# Patient Record
Sex: Female | Born: 1935 | Race: Black or African American | Hispanic: No | Smoking: Never smoker
Health system: Southern US, Community
[De-identification: ages and names within clinical notes are randomized; demographics above are authoritative.]

## PROBLEM LIST (undated history)

## (undated) DIAGNOSIS — I10 Essential (primary) hypertension: Secondary | ICD-10-CM

## (undated) DIAGNOSIS — I358 Other nonrheumatic aortic valve disorders: Secondary | ICD-10-CM

## (undated) HISTORY — PX: APPENDECTOMY: SHX54

---

## 2004-12-07 ENCOUNTER — Ambulatory Visit: Payer: Self-pay | Admitting: Unknown Physician Specialty

## 2004-12-27 ENCOUNTER — Ambulatory Visit: Payer: Self-pay | Admitting: Unknown Physician Specialty

## 2005-02-21 ENCOUNTER — Ambulatory Visit: Payer: Self-pay | Admitting: Gastroenterology

## 2006-03-22 ENCOUNTER — Ambulatory Visit: Payer: Self-pay | Admitting: Unknown Physician Specialty

## 2007-04-26 ENCOUNTER — Ambulatory Visit: Payer: Self-pay | Admitting: Unknown Physician Specialty

## 2007-12-28 ENCOUNTER — Ambulatory Visit: Payer: Self-pay | Admitting: Unknown Physician Specialty

## 2008-05-01 ENCOUNTER — Ambulatory Visit: Payer: Self-pay | Admitting: Unknown Physician Specialty

## 2009-05-04 ENCOUNTER — Ambulatory Visit: Payer: Self-pay | Admitting: Unknown Physician Specialty

## 2010-04-23 ENCOUNTER — Emergency Department: Payer: Self-pay | Admitting: Emergency Medicine

## 2010-05-14 ENCOUNTER — Ambulatory Visit: Payer: Self-pay | Admitting: Unknown Physician Specialty

## 2010-06-14 ENCOUNTER — Ambulatory Visit: Payer: Self-pay | Admitting: Unknown Physician Specialty

## 2010-10-28 ENCOUNTER — Emergency Department: Payer: Self-pay | Admitting: Internal Medicine

## 2011-02-14 ENCOUNTER — Ambulatory Visit: Payer: Self-pay | Admitting: Unknown Physician Specialty

## 2011-09-07 ENCOUNTER — Ambulatory Visit: Payer: Self-pay | Admitting: Unknown Physician Specialty

## 2017-08-08 ENCOUNTER — Ambulatory Visit
Admission: RE | Admit: 2017-08-08 | Discharge: 2017-08-08 | Disposition: A | Discharge status: Home / Self Care | Payer: Medicare Other | Source: Ambulatory Visit | Attending: Family Medicine | Admitting: Family Medicine

## 2017-08-08 ENCOUNTER — Other Ambulatory Visit: Payer: Self-pay | Admitting: Family Medicine

## 2017-08-08 DIAGNOSIS — M79605 Pain in left leg: Secondary | ICD-10-CM

## 2017-08-10 ENCOUNTER — Emergency Department
Admission: EM | Admit: 2017-08-10 | Discharge: 2017-08-10 | Disposition: A | Discharge status: Home / Self Care | Payer: Medicare Other | Attending: Emergency Medicine | Admitting: Emergency Medicine

## 2017-08-10 ENCOUNTER — Emergency Department: Payer: Medicare Other

## 2017-08-10 ENCOUNTER — Other Ambulatory Visit: Payer: Self-pay

## 2017-08-10 ENCOUNTER — Encounter: Payer: Self-pay | Admitting: Emergency Medicine

## 2017-08-10 DIAGNOSIS — M109 Gout, unspecified: Secondary | ICD-10-CM | POA: Diagnosis not present

## 2017-08-10 DIAGNOSIS — Z7982 Long term (current) use of aspirin: Secondary | ICD-10-CM | POA: Insufficient documentation

## 2017-08-10 DIAGNOSIS — Z79899 Other long term (current) drug therapy: Secondary | ICD-10-CM | POA: Insufficient documentation

## 2017-08-10 DIAGNOSIS — M25562 Pain in left knee: Secondary | ICD-10-CM | POA: Diagnosis present

## 2017-08-10 DIAGNOSIS — I10 Essential (primary) hypertension: Secondary | ICD-10-CM | POA: Diagnosis not present

## 2017-08-10 HISTORY — DX: Other nonrheumatic aortic valve disorders: I35.8

## 2017-08-10 HISTORY — DX: Essential (primary) hypertension: I10

## 2017-08-10 LAB — CBC
HCT: 38.3 % (ref 35.0–47.0)
Hemoglobin: 12.5 g/dL (ref 12.0–16.0)
MCH: 28.2 pg (ref 26.0–34.0)
MCHC: 32.6 g/dL (ref 32.0–36.0)
MCV: 86.6 fL (ref 80.0–100.0)
PLATELETS: 221 10*3/uL (ref 150–440)
RBC: 4.42 MIL/uL (ref 3.80–5.20)
RDW: 14.6 % — AB (ref 11.5–14.5)
WBC: 9.4 10*3/uL (ref 3.6–11.0)

## 2017-08-10 LAB — BASIC METABOLIC PANEL
Anion gap: 14 (ref 5–15)
BUN: 35 mg/dL — AB (ref 6–20)
CALCIUM: 9.9 mg/dL (ref 8.9–10.3)
CO2: 20 mmol/L — ABNORMAL LOW (ref 22–32)
CREATININE: 1.25 mg/dL — AB (ref 0.44–1.00)
Chloride: 104 mmol/L (ref 101–111)
GFR calc Af Amer: 42 mL/min — ABNORMAL LOW (ref >60)
GFR, EST NON AFRICAN AMERICAN: 36 mL/min — AB (ref >60)
GLUCOSE: 119 mg/dL — AB (ref 65–99)
Potassium: 3.3 mmol/L — ABNORMAL LOW (ref 3.5–5.1)
Sodium: 138 mmol/L (ref 135–145)

## 2017-08-10 LAB — SYNOVIAL CELL COUNT + DIFF, W/ CRYSTALS
Eosinophils-Synovial: 0 %
LYMPHOCYTES-SYNOVIAL FLD: 2 %
Monocyte-Macrophage-Synovial Fluid: 0 %
NEUTROPHIL, SYNOVIAL: 98 %
WBC, Synovial: 22613 /mm3 — ABNORMAL HIGH (ref 0–200)

## 2017-08-10 LAB — TROPONIN I: Troponin I: <0.03 ng/mL (ref <0.03)

## 2017-08-10 LAB — URIC ACID: Uric Acid, Serum: 10.5 mg/dL — ABNORMAL HIGH (ref 2.3–6.6)

## 2017-08-10 MED ORDER — LIDOCAINE HCL (PF) 1 % IJ SOLN
INTRAMUSCULAR | Status: AC
  Filled 2017-08-10: qty 5

## 2017-08-10 MED ORDER — PREDNISONE 20 MG PO TABS: 40.0000 mg | ORAL_TABLET | Freq: Every day | ORAL | 0 refills | Status: DC

## 2017-08-10 MED ORDER — PREDNISONE 20 MG PO TABS
60.0000 mg | ORAL_TABLET | Freq: Once | ORAL | Status: AC
  Administered 2017-08-10: 60 mg via ORAL
  Filled 2017-08-10: qty 3

## 2017-08-10 NOTE — ED Notes (Signed)
Pt ambulated with assistance of walker. MD at bedside

## 2017-08-10 NOTE — ED Triage Notes (Signed)
Pt presents to ED via [facility] with c/o L leg swelling and pain and unable to ambulate. Per EMS pt has been unable to ambulate x 3 days, with leg swelling x 2 days. EMS reports pt was seen at [facility] for evaluation and blood clot was ruled out with negative exam. Pt is alert and oriented at this time.

## 2017-08-10 NOTE — Discharge Instructions (Signed)
Please take your steroids as prescribed for the next 5 days. You may also use over-the-counter ibuprofen as needed for discomfort.  Return to the emergency department for any increased pain, development of fever, or any other symptom personally concerning to yourself.  Drink plenty of fluids and obtain plenty of rest.

## 2017-08-10 NOTE — ED Notes (Signed)
Pt assisted with bed pan

## 2017-08-10 NOTE — ED Provider Notes (Addendum)
[facility] Emergency Department Provider Note  Time seen: 11:17 AM  I have reviewed the triage vital signs and the nursing notes.   HISTORY  Chief Complaint Leg Pain    HPI Ashley Wiggins is a 84 y.o. female with a past medical history of hypertension presents to the emergency department for left knee pain.  According to the patient for the past 3 days she has not been able to walk well due to left knee pain and swelling.  States she went to her doctor [facility] for evaluation.  Had an ultrasound that ruled out a blood clot and the patient was sent to the emergency department for further evaluation.  Patient denies any fever.  She does state a past history of gout one time previously in her right foot but never in her knee.  Denies any nausea or vomiting.  Patient states moderate pain in her left knee currently but becomes severe with attempted ambulation.  Largely negative review of systems.   Past Medical History:  Diagnosis Date  . Aortic valve sclerosis   . Hypertension     There are no active problems to display for this patient.   Past Surgical History:  Procedure Laterality Date  . APPENDECTOMY      Prior to Admission medications   Medication Sig Start Date End Date Taking? Authorizing Provider  amLODipine (NORVASC) 10 MG tablet Take 10 mg by mouth daily. 06/19/17  Yes [provider]  aspirin EC 81 MG tablet Take 81 mg by mouth daily.   Yes [provider]  atorvastatin (LIPITOR) 40 MG tablet Take 40 mg by mouth at bedtime. 06/16/17  Yes [provider]  diphenhydrAMINE (BENADRYL) 25 mg capsule Take 25 mg by mouth daily as needed for allergies.   Yes [provider]  potassium chloride (K-DUR) 10 MEQ tablet Take 1 tablet by mouth daily. 03/28/17  Yes [provider]  triamterene-hydrochlorothiazide (MAXZIDE-25) 37.5-25 MG tablet Take 0.5 tablets by mouth daily. 06/08/17  Yes [provider]   vitamin B-12 (CYANOCOBALAMIN) 1000 MCG tablet Take 1,000 mcg by mouth daily.   Yes [provider]    No Known Allergies  History reviewed. No pertinent family history.  Social History Social History   Tobacco Use  . Smoking status: Never Smoker  . Smokeless tobacco: Never Used  Substance Use Topics  . Alcohol use: No    Frequency: Never  . Drug use: No    Review of Systems Constitutional: Negative for fever. Eyes: Negative for visual complaints ENT: Negative for recent illness/congestion Cardiovascular: Negative for chest pain. Respiratory: Negative for shortness of breath. Gastrointestinal: Negative for abdominal pain, vomiting Genitourinary: Negative for urinary compaints Musculoskeletal: Left knee pain and swelling times 2-3 days. Skin: Negative for skin complaints  Neurological: Negative for headache All other ROS negative  ____________________________________________   PHYSICAL EXAM:  VITAL SIGNS: ED Triage Vitals  Enc Vitals Group     BP --      Pulse Rate 08/10/17 1032 (!) 102     Resp 08/10/17 1032 18     Temp 08/10/17 1032 98.4 F (36.9 C)     Temp Source 08/10/17 1032 Oral     SpO2 08/10/17 1032 98 %     Weight 08/10/17 1032 145 lb (65.8 kg)     Height 08/10/17 1032 5\' 7"  (1.702 m)     Head Circumference --      Peak Flow --      Pain  Score 08/10/17 1031 10     Pain Loc --      Pain Edu? --      Excl. in GC? --     Constitutional: Alert and oriented. Well appearing and in no distress. Eyes: Normal exam ENT   Head: Normocephalic and atraumatic.   Mouth/Throat: Mucous membranes are moist. Cardiovascular: Normal rate, regular rhythm. No murmur Respiratory: Normal respiratory effort without tachypnea nor retractions. Breath sounds are clear  Gastrointestinal: Soft and nontender. No distention.  Musculoskeletal: Moderate left knee effusion noted on examination.  Moderate tenderness to palpation appears to be neurovascularly  intact distally. Neurologic:  Normal speech and language. No gross focal neurologic deficits Skin:  Skin is warm, dry and intact.  Psychiatric: Mood and affect are normal.   ____________________________________________    EKG performed in triage.  Reviewed and interpreted by myself shows normal sinus rhythm at 98 bpm with a widened QRS, left axis deviation, largely normal intervals with nonspecific ST changes.  Does appear changed from 2011.  RADIOLOGY  IMPRESSION: Osteoarthritic change medially. Extensive subchondral cystic change noted in the medial aspect of the medial compartment along both sides of the joint. There is extensive chondrocalcinosis. Chondrocalcinosis may be seen with osteoarthritis or calcium pyrophosphate deposition disease.  No fracture or joint effusion. Spurring noted in all compartments.  There is extensive atherosclerotic calcification in the visualized superficial femoral artery, popliteal artery, and trifurcation arterial vessels.  ____________________________________________   INITIAL IMPRESSION / ASSESSMENT AND PLAN / ED COURSE  Pertinent labs & imaging results that were available during my care of the patient were reviewed by me and considered in my medical decision making (see chart for details).  Patient presents the emergency department for left knee pain and swelling times 2-3 days.  Now significant difficulty ambulating due to pain.  Moderate effusion on exam.  Differential would include septic knee, gouty arthritis.  Patient denies any trauma or falls.  We will perform a arthrocentesis as well as an x-ray to further evaluate.  I was able to review the patient's labs from 08/08/17 in care everywhere, they are largely within normal limits.   Apiration of blood/fluid Performed by: Minna Antis Verbal consent obtained. Required items: required blood products, implants, devices, and special equipment available Patient identity confirmed:  verbally with patient Preparation: Patient was prepped and draped in the usual sterile fashion. Patient tolerance: Patient tolerated the procedure well with no immediate complications.  Location of aspiration: Left knee 60 cc of yellow fluid removed.   An EKG was performed in triage showing nonspecific ST changes.  Patient has no complaints of chest pain or shortness of breath.  We will obtain lab work as a precaution including cardiac enzymes given the abnormal EKG.   Patient's labs are largely within normal limits besides an elevated uric acid level.  Synovial fluid has resulted showing 22,000 white blood cells with monosodium urate crystals again consistent with gout.  A culture has been sent.  Patient is afebrile with a normal white blood cell count.  Highly suspect gouty arthritis to be the cause of the patient's discomfort.  We will treat with 5 days of prednisone as well as ibuprofen if needed.  Patient will follow up with her doctor.  Patient states her knee actually feels much better after the fluid was removed she believes she will be able to ambulate without difficulty.  We will ensure safe ambulation prior to discharge.  X-ray consistent with arthritis/gout.  Patient is having some trouble with  ambulation.  She normally ambulates without any assistance.  We gave her a walker in the emergency department and she was able to ambulate somewhat better with a walker.  However given her trouble ambulating which she says is due to feeling like she might fall, I offered to involve social work and physical therapy for placement to a rehab facility.  At this time the patient does not wish to go to rehab facility.  The daughter states she will go home with the patient.  I discussed with them if they are unable to safely ambulate at home she needs to call her primary care doctor or return to the emergency department.  They are agreeable to this  plan. ____________________________________________   FINAL CLINICAL IMPRESSION(S) / ED DIAGNOSES  Left knee pain Gout   Minna AntisPaduchowski, Dandra Velardi, MD 08/10/17 1416    Minna AntisPaduchowski, Caniya Tagle, MD 08/10/17 1510

## 2017-08-13 LAB — BODY FLUID CULTURE: Culture: NO GROWTH

## 2018-01-11 ENCOUNTER — Emergency Department: Payer: Medicare Other

## 2018-01-11 ENCOUNTER — Other Ambulatory Visit: Payer: Self-pay

## 2018-01-11 ENCOUNTER — Encounter: Payer: Self-pay | Admitting: Intensive Care

## 2018-01-11 ENCOUNTER — Emergency Department
Admission: EM | Admit: 2018-01-11 | Discharge: 2018-01-11 | Disposition: A | Discharge status: Home / Self Care | Payer: Medicare Other | Attending: Emergency Medicine | Admitting: Emergency Medicine

## 2018-01-11 DIAGNOSIS — I10 Essential (primary) hypertension: Secondary | ICD-10-CM | POA: Diagnosis not present

## 2018-01-11 DIAGNOSIS — R609 Edema, unspecified: Secondary | ICD-10-CM

## 2018-01-11 DIAGNOSIS — Z79899 Other long term (current) drug therapy: Secondary | ICD-10-CM | POA: Diagnosis not present

## 2018-01-11 DIAGNOSIS — Z7982 Long term (current) use of aspirin: Secondary | ICD-10-CM | POA: Diagnosis not present

## 2018-01-11 DIAGNOSIS — R600 Localized edema: Secondary | ICD-10-CM | POA: Diagnosis not present

## 2018-01-11 DIAGNOSIS — R2243 Localized swelling, mass and lump, lower limb, bilateral: Secondary | ICD-10-CM | POA: Diagnosis present

## 2018-01-11 LAB — BASIC METABOLIC PANEL
ANION GAP: 16 — AB (ref 5–15)
BUN: 27 mg/dL — ABNORMAL HIGH (ref 6–20)
CALCIUM: 10 mg/dL (ref 8.9–10.3)
CHLORIDE: 103 mmol/L (ref 101–111)
CO2: 19 mmol/L — AB (ref 22–32)
Creatinine, Ser: 1.21 mg/dL — ABNORMAL HIGH (ref 0.44–1.00)
GFR calc non Af Amer: 38 mL/min — ABNORMAL LOW (ref >60)
GFR, EST AFRICAN AMERICAN: 44 mL/min — AB (ref >60)
GLUCOSE: 120 mg/dL — AB (ref 65–99)
POTASSIUM: 3.3 mmol/L — AB (ref 3.5–5.1)
Sodium: 138 mmol/L (ref 135–145)

## 2018-01-11 LAB — TROPONIN I: Troponin I: 0.03 ng/mL (ref <0.03)

## 2018-01-11 LAB — CBC WITH DIFFERENTIAL/PLATELET
BASOS PCT: 0 %
Basophils Absolute: 0 10*3/uL (ref 0–0.1)
Eosinophils Absolute: 0 10*3/uL (ref 0–0.7)
Eosinophils Relative: 0 %
HEMATOCRIT: 35.2 % (ref 35.0–47.0)
HEMOGLOBIN: 11.4 g/dL — AB (ref 12.0–16.0)
LYMPHS PCT: 10 %
Lymphs Abs: 1.1 10*3/uL (ref 1.0–3.6)
MCH: 27.7 pg (ref 26.0–34.0)
MCHC: 32.5 g/dL (ref 32.0–36.0)
MCV: 85.2 fL (ref 80.0–100.0)
MONO ABS: 1.3 10*3/uL — AB (ref 0.2–0.9)
MONOS PCT: 12 %
NEUTROS ABS: 8.6 10*3/uL — AB (ref 1.4–6.5)
Neutrophils Relative %: 78 %
Platelets: 269 10*3/uL (ref 150–440)
RBC: 4.14 MIL/uL (ref 3.80–5.20)
RDW: 14.9 % — AB (ref 11.5–14.5)
WBC: 11 10*3/uL (ref 3.6–11.0)

## 2018-01-11 MED ORDER — MEDICAL COMPRESSION STOCKINGS MISC: 0 refills | Status: DC

## 2018-01-11 NOTE — ED Triage Notes (Signed)
Patient presents today with bilateral feet swelling since Tuesday. Patient has been taking lasix as needed prescribed by PCP. Patient denies pain while sitting in wheelchair. Patient is able to ambulate some with pain.

## 2018-01-11 NOTE — ED Provider Notes (Signed)
[facility] Emergency Department Provider Note  Time seen: 5:47 PM  I have reviewed the triage vital signs and the nursing notes.   HISTORY  Chief Complaint Leg Swelling (bilateral feet swelling)    HPI Ashley Wiggins is a 84 y.o. female with a past medical history of hypertension, mild peripheral edema, presents to the emergency department for lower extremity edema.  According to the patient and daughter for the past 1 week or so the patient's feet have been swelling bilaterally.  Patient states occasionally her left lower extremity will swell she is on Lasix just to use as needed.  States for the past few days both feet have been swelling the left greater than the right.  Denies any leg pain.  Denies any chest pain or trouble breathing.  No history of blood clots.  Patient has been using Lasix for the past 2 days.   Past Medical History:  Diagnosis Date  . Aortic valve sclerosis   . Hypertension     There are no active problems to display for this patient.   Past Surgical History:  Procedure Laterality Date  . APPENDECTOMY      Prior to Admission medications   Medication Sig Start Date End Date Taking? Authorizing Provider  amLODipine (NORVASC) 10 MG tablet Take 10 mg by mouth daily. 06/19/17   [provider]  aspirin EC 81 MG tablet Take 81 mg by mouth daily.    [provider]  atorvastatin (LIPITOR) 40 MG tablet Take 40 mg by mouth at bedtime. 06/16/17   [provider]  diphenhydrAMINE (BENADRYL) 25 mg capsule Take 25 mg by mouth daily as needed for allergies.    [provider]  potassium chloride (K-DUR) 10 MEQ tablet Take 1 tablet by mouth daily. 03/28/17   [provider]  predniSONE (DELTASONE) 20 MG tablet Take 2 tablets (40 mg total) by mouth daily. 08/10/17   Minna AntisPaduchowski, Jaidy Cottam, MD  triamterene-hydrochlorothiazide (MAXZIDE-25) 37.5-25 MG tablet Take 0.5 tablets by mouth daily. 06/08/17   [provider]  vitamin B-12 (CYANOCOBALAMIN) 1000 MCG tablet Take 1,000 mcg by mouth daily.    [provider]    No Known Allergies  History reviewed. No pertinent family history.  Social History Social History   Tobacco Use  . Smoking status: Never Smoker  . Smokeless tobacco: Never Used  Substance Use Topics  . Alcohol use: No    Frequency: Never  . Drug use: No    Review of Systems Constitutional: Negative for fever. Eyes: Negative for visual complaints ENT: Negative for recent illness/congestion Cardiovascular: Negative for chest pain. Respiratory: Negative for shortness of breath. Gastrointestinal: Negative for abdominal pain Genitourinary: Negative for urinary compaints Musculoskeletal: Bilateral lower extremity edema/pedal edema, left greater than right. Skin: Negative for skin complaints  Neurological: Negative for headache All other ROS negative  ____________________________________________   PHYSICAL EXAM:  VITAL SIGNS: ED Triage Vitals [01/11/18 1603]  Enc Vitals Group     BP 123/63     Pulse Rate (!) 101     Resp 16     Temp 98.2 F (36.8 C)     Temp Source Oral     SpO2 98 %     Weight 140 lb (63.5 kg)     Height 5' (1.524 m)     Head Circumference      Peak Flow      Pain Score 0     Pain Loc  Pain Edu?      Excl. in GC?    Constitutional: Alert and oriented. Well appearing and in no distress. Eyes: Normal exam ENT   Head: Normocephalic and atraumatic.   Mouth/Throat: Mucous membranes are moist. Cardiovascular: Normal rate, regular rhythm.  2/6 systolic murmur. Respiratory: Normal respiratory effort without tachypnea nor retractions. Breath sounds are clear Gastrointestinal: Soft and nontender. No distention.  Musculoskeletal: Nontender with normal range of motion in all extremities.  Patient has moderate pedal edema in the left lower extremity, mild in the right lower extremity.  No calf tenderness.  No  discoloration. Neurologic:  Normal speech and language. No gross focal neurologic deficits  Skin:  Skin is warm, dry and intact.  Psychiatric: Mood and affect are normal.   ____________________________________________   RADIOLOGY  Ultrasound shows no evidence of DVT.  Difficult to evaluate the calf veins but unlikely DVT.  ____________________________________________   INITIAL IMPRESSION / ASSESSMENT AND PLAN / ED COURSE  Pertinent labs & imaging results that were available during my care of the patient were reviewed by me and considered in my medical decision making (see chart for details).  Patient presents emergency department for bilateral lower extremity edema, left greater than right.  History of mild peripheral edema in the past is on Lasix as needed.  Has been taking for the past 2 days but continued to have swelling.  Differential would include CHF, peripheral edema, DVT.  Will obtain an ultrasound to help rule out DVT.  We will check basic labs including cardiac enzymes as a precaution.  Patient agreeable to this plan of care.  I discussed with the patient she should be using compression stockings, daughter states the patient has been told to use them but is not in the past because they hurt her legs.  Ultrasound shows no definitive DVT.  I discussed with patient to continue Lasix as needed follow-up with her doctor and to use compression stockings.  Patient agreeable to plan of care.  Also discussed elevation especially at night and when not in use.  ____________________________________________   FINAL CLINICAL IMPRESSION(S) / ED DIAGNOSES  Peripheral edema    Minna Antis, MD 01/11/18 1930

## 2018-01-11 NOTE — ED Notes (Signed)
US called to room patient in room 14 when exam completed.

## 2018-01-11 NOTE — ED Notes (Signed)
Date and time results received: 01/11/18 1809 (use smartphrase ".now" to insert current time)  Test: Troponin Critical Value: 0.03  Name of Provider Notified: Dr. Alvera NovelPadochowski   Orders Received? Or Actions Taken?:

## 2018-11-13 IMAGING — DX DG KNEE COMPLETE 4+V*L*
4 series · 4 of 4 positions shown · non-contrast
Comparison: None.

CLINICAL DATA: Pain and swelling

EXAM:
LEFT KNEE - COMPLETE 4+ VIEW

[knee ap]
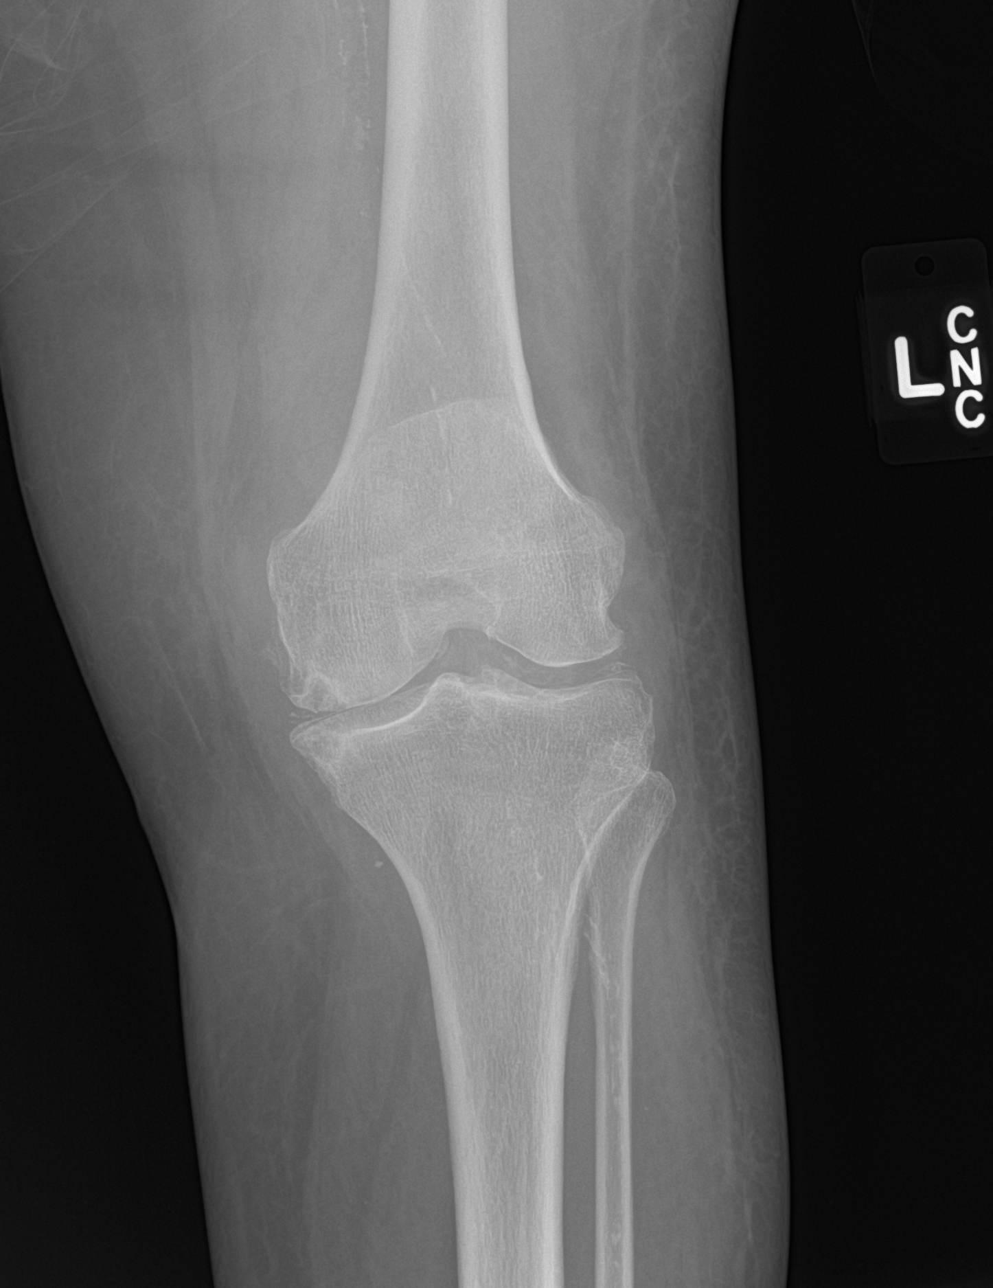

[knee lat]
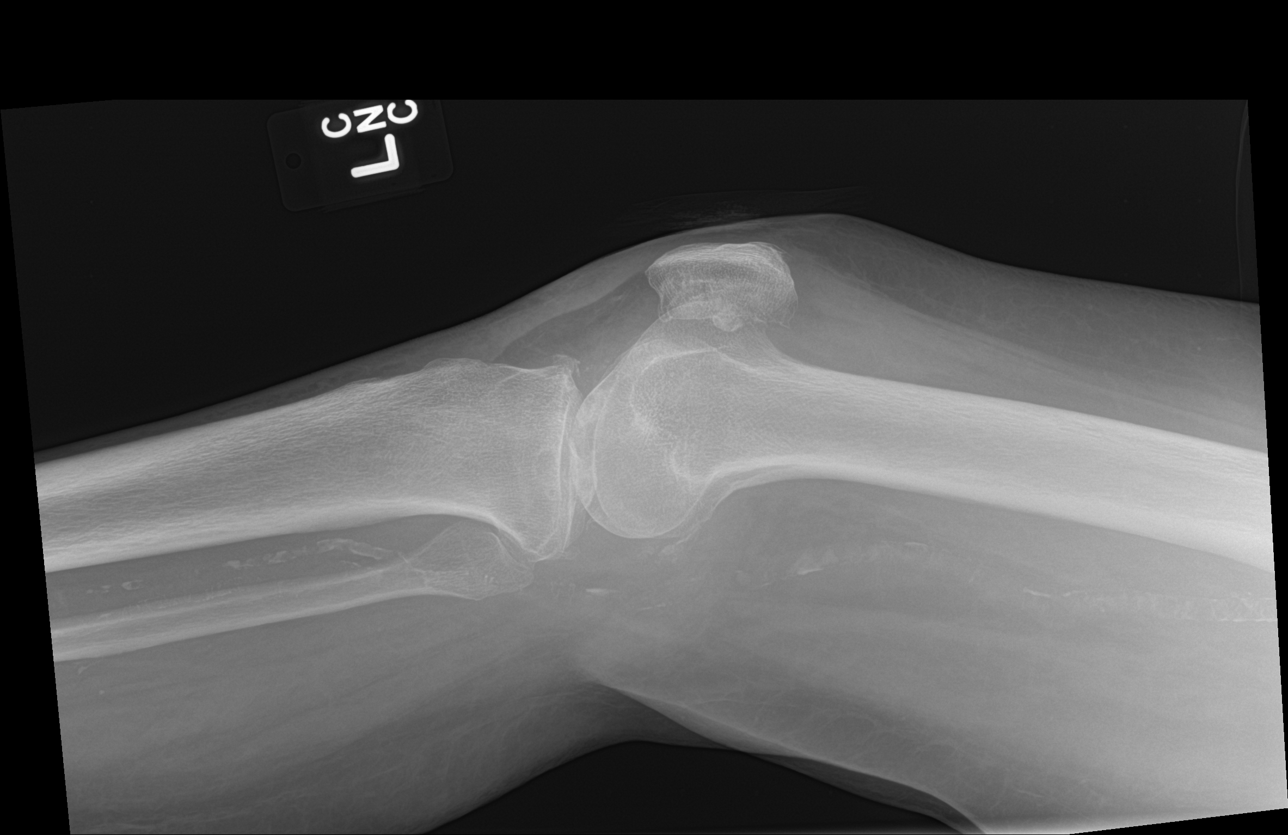

[knee obl (1 of 2)]
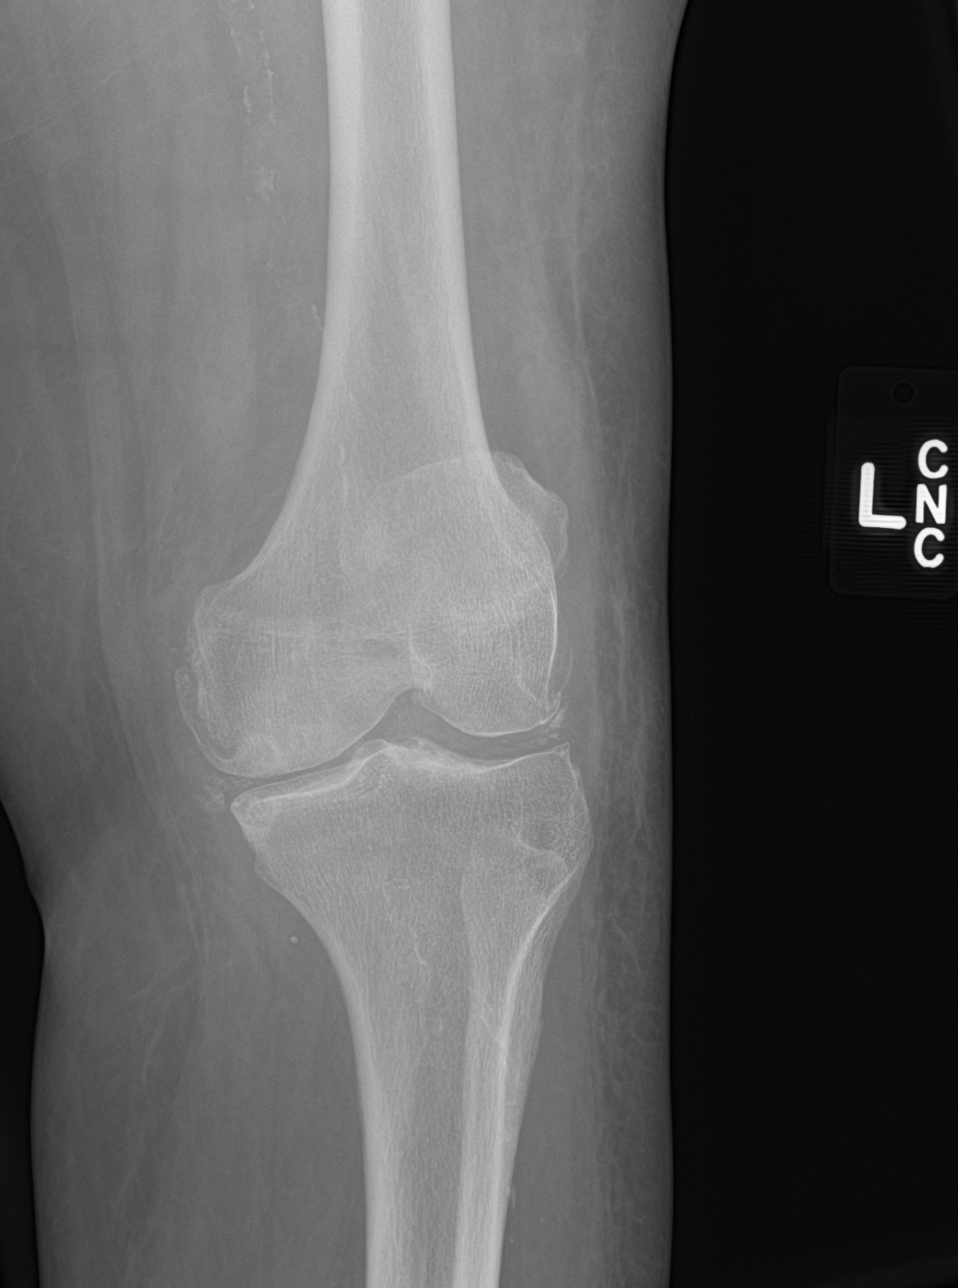

[knee obl (2 of 2)]
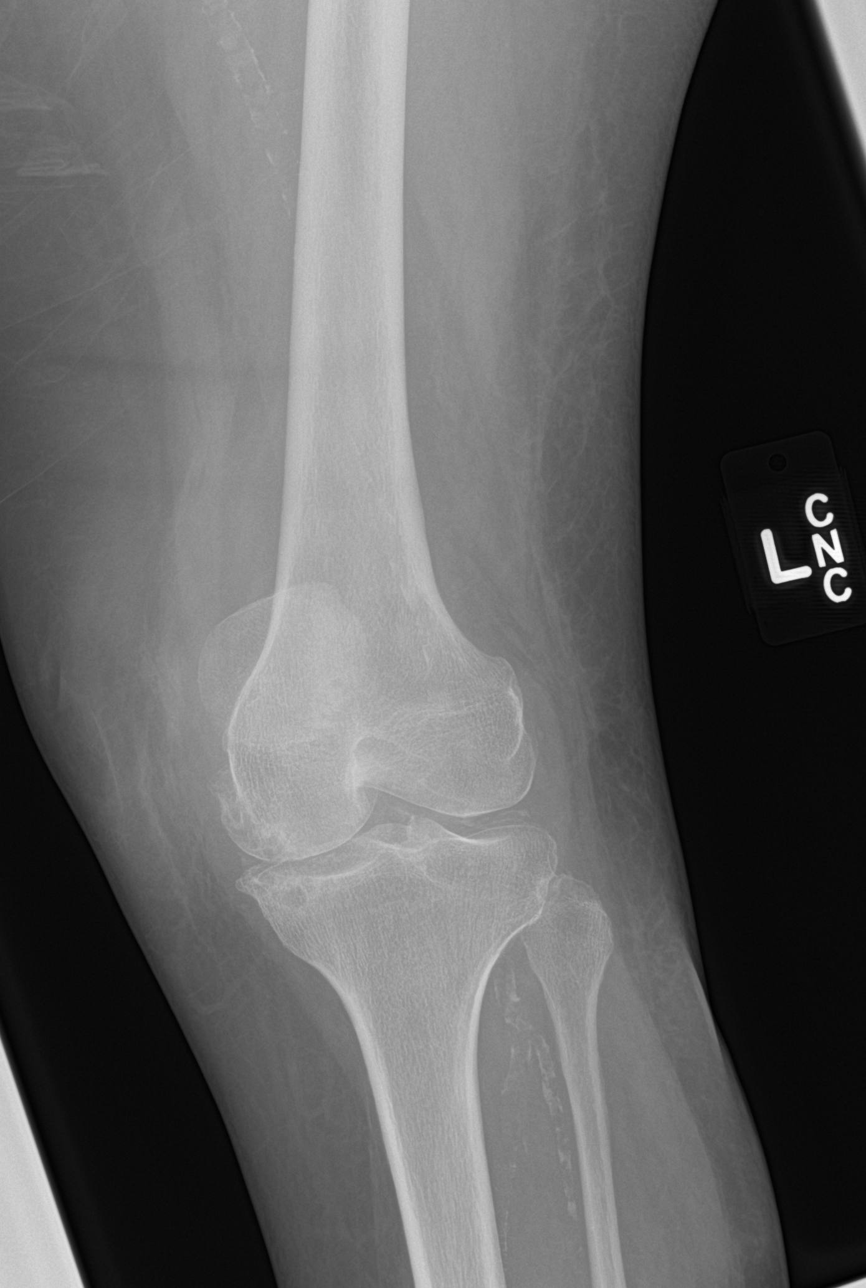

[4 of 4 positions shown; findings below may reference images not displayed]

FINDINGS: Frontal, lateral, and bilateral oblique views were obtained. There
is no acute fracture or dislocation. No joint effusion. There is
joint space narrowing medially with subchondral cystic change along
the medial aspects of the distal femur and proximal tibia. There is
extensive chondrocalcinosis. Is spurring in all compartments.

There is extensive arterial vascular calcification.
IMPRESSION: Osteoarthritic change medially. Extensive subchondral cystic change
noted in the medial aspect of the medial compartment along both
sides of the joint. There is extensive chondrocalcinosis.
Chondrocalcinosis may be seen with osteoarthritis or calcium
pyrophosphate deposition disease.

No fracture or joint effusion.  Spurring noted in all compartments.

There is extensive atherosclerotic calcification in the visualized
superficial femoral artery, popliteal artery, and trifurcation
arterial vessels.

## 2019-10-16 IMAGING — US US EXTREM LOW VENOUS BILAT
2 series · 13 of 24 positions shown · non-contrast
Comparison: None.

CLINICAL DATA: [AGE] with bilateral swelling for 2 days.



[Series 1: us extrem low venous bilat · 0.08mm/px · 9 of 62 slices shown (1 of 2)]
[im 1/62]
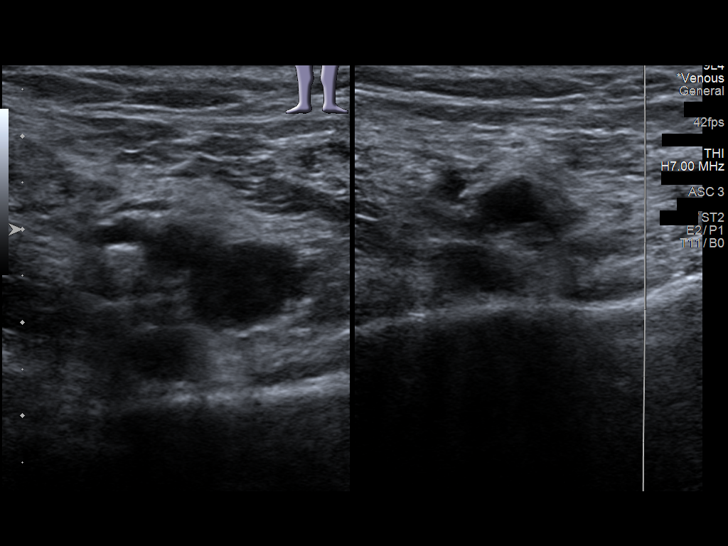
[im 7/62]
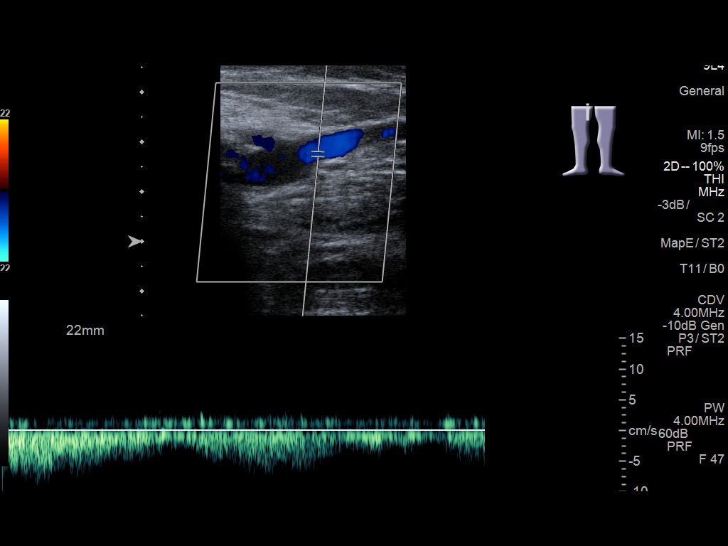
[im 17/62]
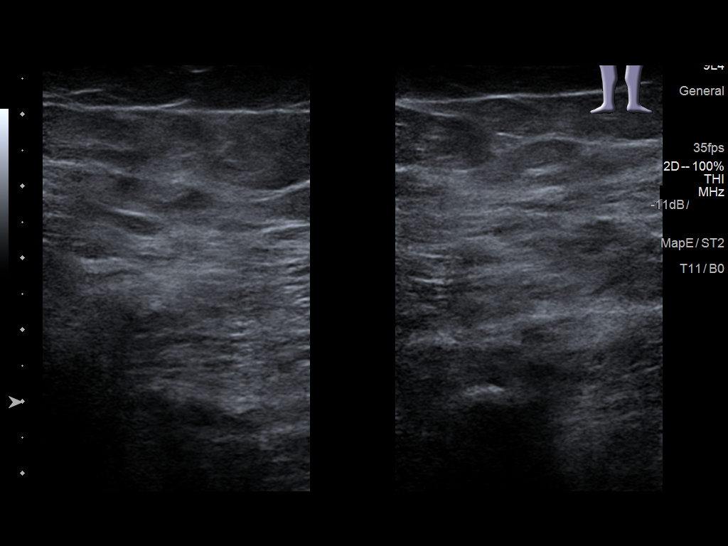
[im 23/62]
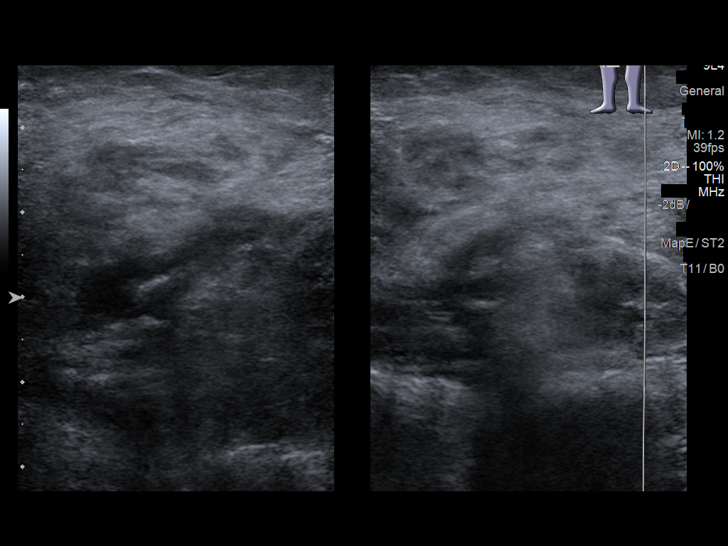
[im 29/62]
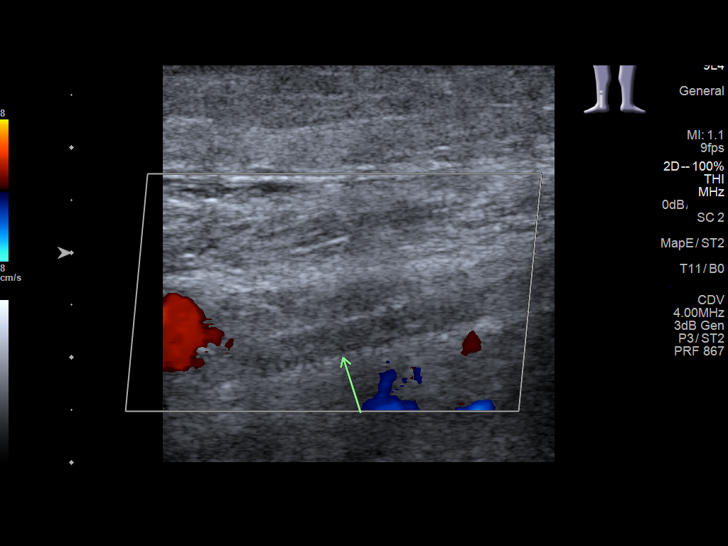
[im 39/62]
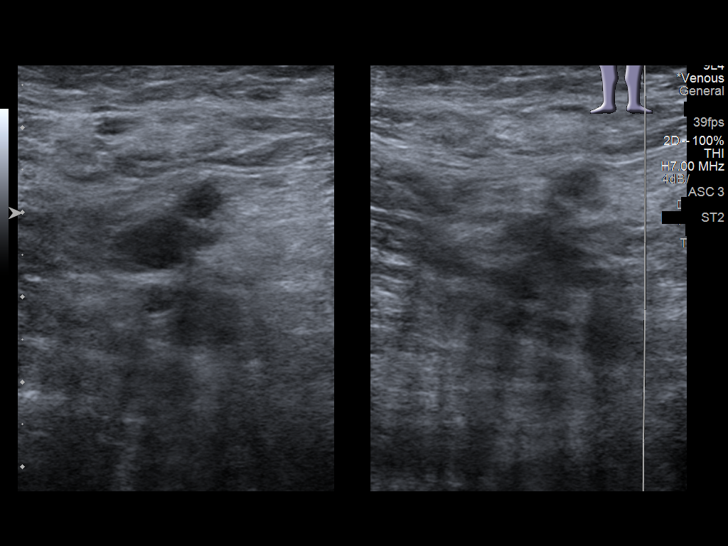
[im 45/62]
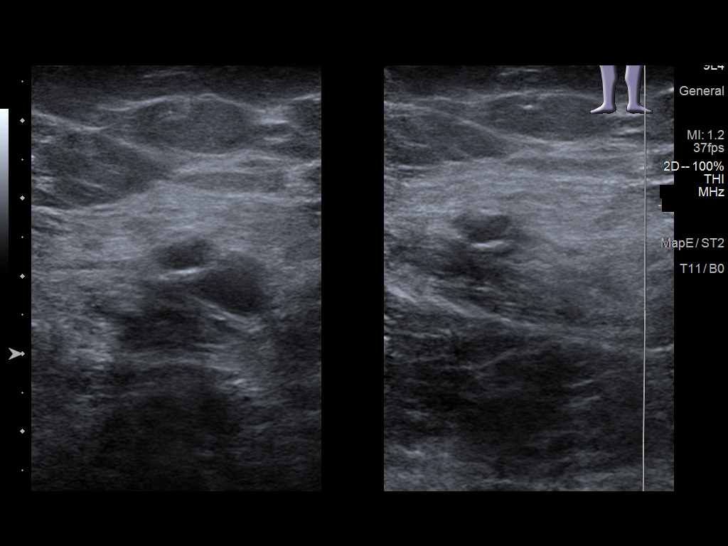
[im 49/62]
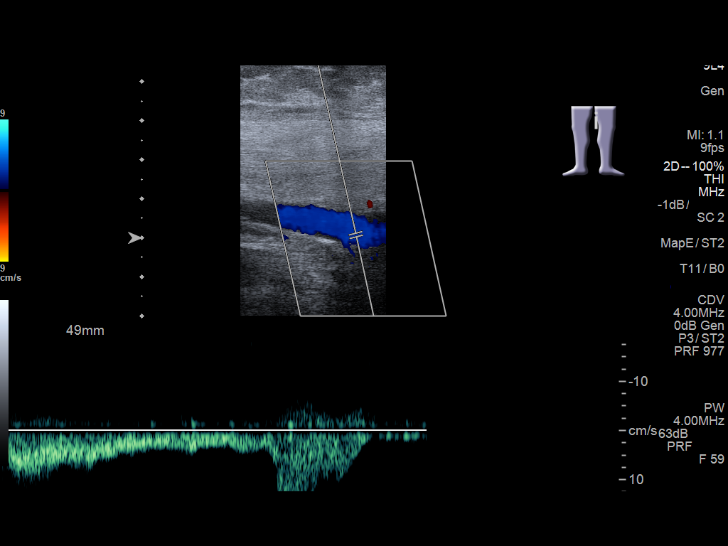
[im 58/62]
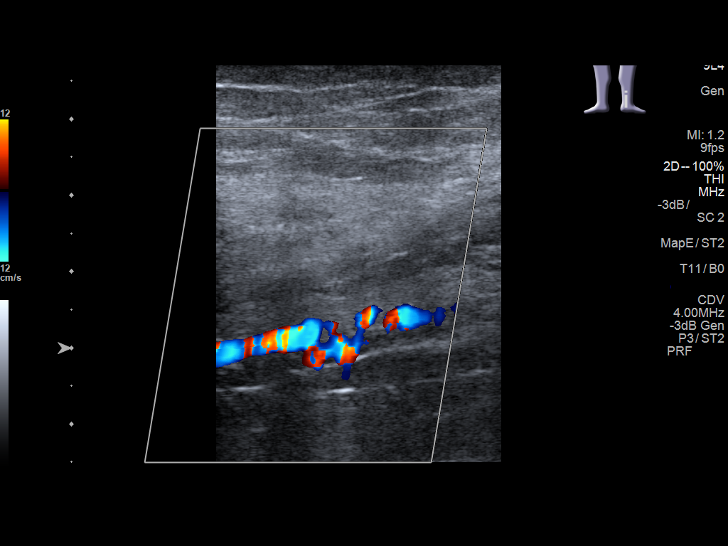

[Series 2: us extrem low venous bilat · 13 acquisitions, 4 frames shown (2 of 2)]
[im 1/13]
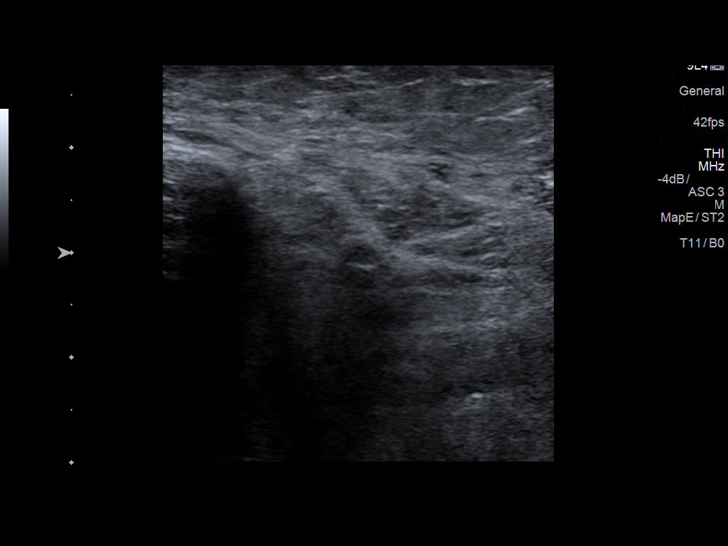
[im 5/13]
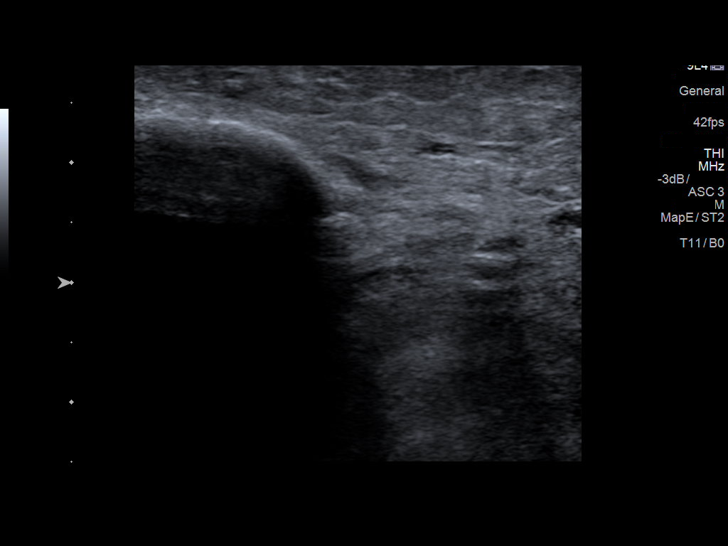
[im 9/13]
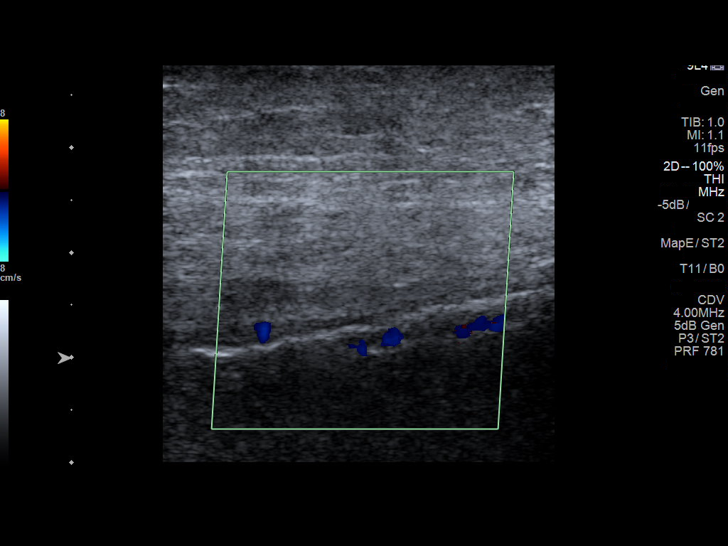
[im 13/13]
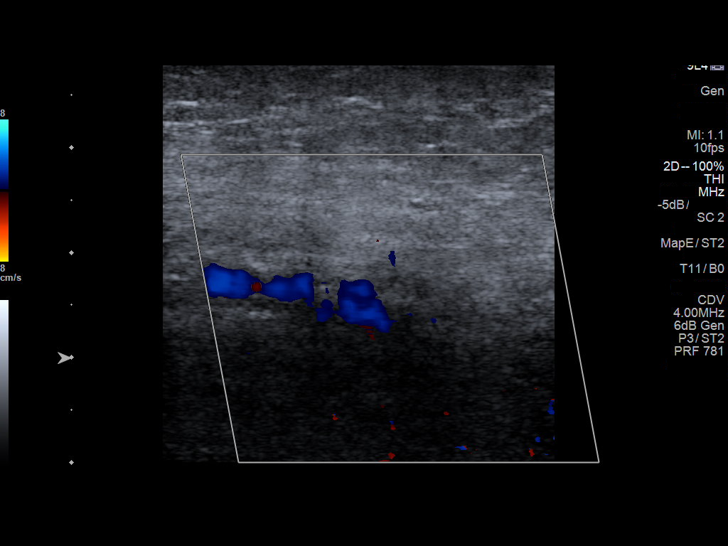

[13 of 24 positions shown; findings below may reference images not displayed]

FINDINGS: RIGHT LOWER EXTREMITY

Common Femoral Vein: No evidence of thrombus. Normal
compressibility, respiratory phasicity and response to augmentation.

Saphenofemoral Junction: No evidence of thrombus. Normal
compressibility and flow on color Doppler imaging.

Profunda Femoral Vein: No evidence of thrombus. Normal
compressibility and flow on color Doppler imaging.

Femoral Vein: No evidence of thrombus. Normal compressibility,
respiratory phasicity and response to augmentation.

Popliteal Vein: No evidence of thrombus. Normal compressibility,
respiratory phasicity and response to augmentation.

Calf Veins: Limited evaluation of the deep calf veins due to
adjacent calcified arterial structures. In particular, posterior
tibial vein is difficult to clearly evaluate.

Other Findings:  None.

LEFT LOWER EXTREMITY

Common Femoral Vein: No evidence of thrombus. Normal
compressibility, respiratory phasicity and response to augmentation.

Saphenofemoral Junction: No evidence of thrombus. Normal
compressibility and flow on color Doppler imaging.

Profunda Femoral Vein: No evidence of thrombus. Normal
compressibility and flow on color Doppler imaging.

Femoral Vein: No evidence of thrombus. Normal compressibility,
respiratory phasicity and response to augmentation.

Popliteal Vein: No evidence of thrombus. Normal compressibility,
respiratory phasicity and response to augmentation.

Calf Veins: Limited evaluation of the deep calf veins. In
particular, the left posterior tibial vein is poorly characterized
due to the adjacent calcified artery.

Other Findings:  None.
IMPRESSION: No evidence for deep venous thrombosis in the lower extremities
above the knees. The deep calf veins are difficult to evaluate
bilaterally due to adjacent calcified arterial structures. Acute
thrombus in the deep calf veins is thought to be unlikely but this
area is poorly characterized. If patient's symptoms continue or
worsen, recommend short-term repeat imaging.

## 2023-08-26 DEATH — deceased
